# Patient Record
Sex: Female | Born: 1955 | Race: White | Hispanic: No | Marital: Single | Smoking: Never smoker
Health system: Southern US, Community
[De-identification: ages and names within clinical notes are randomized; demographics above are authoritative.]

## PROBLEM LIST (undated history)

## (undated) DIAGNOSIS — I10 Essential (primary) hypertension: Secondary | ICD-10-CM

## (undated) DIAGNOSIS — G35 Multiple sclerosis: Secondary | ICD-10-CM

## (undated) DIAGNOSIS — R569 Unspecified convulsions: Secondary | ICD-10-CM

## (undated) DIAGNOSIS — E119 Type 2 diabetes mellitus without complications: Secondary | ICD-10-CM

## (undated) DIAGNOSIS — I639 Cerebral infarction, unspecified: Secondary | ICD-10-CM

## (undated) HISTORY — PX: APPENDECTOMY: SHX54

## (undated) HISTORY — PX: TOTAL HIP ARTHROPLASTY: SHX124

## (undated) HISTORY — PX: INTERSTIM IMPLANT PLACEMENT: SHX5130

---

## 2015-11-16 ENCOUNTER — Encounter (HOSPITAL_COMMUNITY): Payer: Self-pay | Admitting: Cardiology

## 2015-11-16 ENCOUNTER — Emergency Department (HOSPITAL_COMMUNITY): Payer: Medicare Other

## 2015-11-16 ENCOUNTER — Emergency Department (HOSPITAL_COMMUNITY)
Admission: EM | Admit: 2015-11-16 | Discharge: 2015-11-16 | Disposition: A | Discharge status: Home / Self Care | Payer: Medicare Other | Attending: Emergency Medicine | Admitting: Emergency Medicine

## 2015-11-16 DIAGNOSIS — Z79899 Other long term (current) drug therapy: Secondary | ICD-10-CM | POA: Diagnosis not present

## 2015-11-16 DIAGNOSIS — E119 Type 2 diabetes mellitus without complications: Secondary | ICD-10-CM | POA: Insufficient documentation

## 2015-11-16 DIAGNOSIS — R0789 Other chest pain: Secondary | ICD-10-CM | POA: Diagnosis not present

## 2015-11-16 DIAGNOSIS — Z8673 Personal history of transient ischemic attack (TIA), and cerebral infarction without residual deficits: Secondary | ICD-10-CM | POA: Insufficient documentation

## 2015-11-16 DIAGNOSIS — G40909 Epilepsy, unspecified, not intractable, without status epilepticus: Secondary | ICD-10-CM | POA: Diagnosis not present

## 2015-11-16 DIAGNOSIS — Z794 Long term (current) use of insulin: Secondary | ICD-10-CM | POA: Insufficient documentation

## 2015-11-16 DIAGNOSIS — R079 Chest pain, unspecified: Secondary | ICD-10-CM

## 2015-11-16 DIAGNOSIS — Z7984 Long term (current) use of oral hypoglycemic drugs: Secondary | ICD-10-CM | POA: Diagnosis not present

## 2015-11-16 DIAGNOSIS — I10 Essential (primary) hypertension: Secondary | ICD-10-CM | POA: Diagnosis not present

## 2015-11-16 HISTORY — DX: Unspecified convulsions: R56.9

## 2015-11-16 HISTORY — DX: Multiple sclerosis: G35

## 2015-11-16 HISTORY — DX: Essential (primary) hypertension: I10

## 2015-11-16 HISTORY — DX: Cerebral infarction, unspecified: I63.9

## 2015-11-16 HISTORY — DX: Type 2 diabetes mellitus without complications: E11.9

## 2015-11-16 LAB — CBC WITH DIFFERENTIAL/PLATELET
BASOS ABS: 0 10*3/uL (ref 0.0–0.1)
BASOS PCT: 0 %
EOS ABS: 0.1 10*3/uL (ref 0.0–0.7)
Eosinophils Relative: 1 %
HCT: 41.6 % (ref 36.0–46.0)
HEMOGLOBIN: 13.5 g/dL (ref 12.0–15.0)
LYMPHS ABS: 3 10*3/uL (ref 0.7–4.0)
Lymphocytes Relative: 29 %
MCH: 27.8 pg (ref 26.0–34.0)
MCHC: 32.5 g/dL (ref 30.0–36.0)
MCV: 85.6 fL (ref 78.0–100.0)
Monocytes Absolute: 0.7 10*3/uL (ref 0.1–1.0)
Monocytes Relative: 7 %
NEUTROS PCT: 63 %
Neutro Abs: 6.6 10*3/uL (ref 1.7–7.7)
Platelets: 308 10*3/uL (ref 150–400)
RBC: 4.86 MIL/uL (ref 3.87–5.11)
RDW: 14.1 % (ref 11.5–15.5)
WBC: 10.4 10*3/uL (ref 4.0–10.5)

## 2015-11-16 LAB — COMPREHENSIVE METABOLIC PANEL
ALBUMIN: 4 g/dL (ref 3.5–5.0)
ALK PHOS: 72 U/L (ref 38–126)
ALT: 13 U/L — AB (ref 14–54)
AST: 16 U/L (ref 15–41)
Anion gap: 9 (ref 5–15)
BUN: 25 mg/dL — AB (ref 6–20)
CALCIUM: 8.9 mg/dL (ref 8.9–10.3)
CHLORIDE: 109 mmol/L (ref 101–111)
CO2: 23 mmol/L (ref 22–32)
CREATININE: 0.9 mg/dL (ref 0.44–1.00)
GFR calc Af Amer: >60 mL/min (ref >60)
GFR calc non Af Amer: >60 mL/min (ref >60)
GLUCOSE: 124 mg/dL — AB (ref 65–99)
Potassium: 3.8 mmol/L (ref 3.5–5.1)
SODIUM: 141 mmol/L (ref 135–145)
Total Bilirubin: 0.6 mg/dL (ref 0.3–1.2)
Total Protein: 7 g/dL (ref 6.5–8.1)

## 2015-11-16 LAB — I-STAT TROPONIN, ED
Troponin i, poc: 0.01 ng/mL (ref 0.00–0.08)
Troponin i, poc: 0.01 ng/mL (ref 0.00–0.08)

## 2015-11-16 MED ORDER — ACETAMINOPHEN 325 MG PO TABS
650.0000 mg | ORAL_TABLET | Freq: Once | ORAL | Status: AC
  Administered 2015-11-16: 650 mg via ORAL
  Filled 2015-11-16: qty 2

## 2015-11-16 MED ORDER — SODIUM CHLORIDE 0.9 % IV BOLUS (SEPSIS)
500.0000 mL | Freq: Once | INTRAVENOUS | Status: AC
  Administered 2015-11-16: 500 mL via INTRAVENOUS

## 2015-11-16 MED ORDER — DILTIAZEM HCL 30 MG PO TABS
30.0000 mg | ORAL_TABLET | Freq: Once | ORAL | Status: AC
  Administered 2015-11-16: 30 mg via ORAL
  Filled 2015-11-16: qty 1

## 2015-11-16 MED ORDER — ACETAMINOPHEN 325 MG PO TABS
ORAL_TABLET | ORAL | Status: AC
  Filled 2015-11-16: qty 1

## 2015-11-16 NOTE — Discharge Instructions (Signed)
Rest this weekend and follow-up with their family doctor this week for recheck

## 2015-11-16 NOTE — ED Notes (Signed)
Episode of chest pain and left arm pain.  Also states she felt like she was going to pass out.  Denies any chest pain at this time.  Continues to c/o left arm pain and dizziness.

## 2015-11-16 NOTE — ED Provider Notes (Signed)
CSN: [id number]     Arrival date & time 11/16/15  1600 History   First MD Initiated Contact with Patient 11/16/15 1601     Chief Complaint  Patient presents with  . Chest Pain     (Consider location/radiation/quality/duration/timing/severity/associated sxs/prior Treatment) Patient is a 60 y.o. female presenting with chest pain. The history is provided by the patient (Patient states she had some dizziness today with some chest pain. The chest pain was left-sided last just for a few minutes).  Chest Pain Pain location:  L chest Pain quality: aching   Pain radiates to:  Does not radiate Pain radiates to the back: no   Pain severity:  Mild Onset quality:  Sudden Timing:  Rare Progression:  Resolved Chronicity:  New Associated symptoms: no abdominal pain, no back pain, no cough, no fatigue and no headache     Past Medical History  Diagnosis Date  . Stroke (HCC)   . Multiple sclerosis (HCC)   . Diabetes mellitus without complication (HCC)   . Hypertension   . Seizures [facility])    Past Surgical History  Procedure Laterality Date  . Total hip arthroplasty    . Appendectomy     History reviewed. No pertinent family history. Social History  Substance Use Topics  . Smoking status: Never Smoker   . Smokeless tobacco: None  . Alcohol Use: No   OB History    No data available     Review of Systems  Constitutional: Negative for appetite change and fatigue.  HENT: Negative for congestion, ear discharge and sinus pressure.   Eyes: Negative for discharge.  Respiratory: Negative for cough.   Cardiovascular: Positive for chest pain.  Gastrointestinal: Negative for abdominal pain and diarrhea.  Genitourinary: Negative for frequency and hematuria.  Musculoskeletal: Negative for back pain.  Skin: Negative for rash.  Neurological: Negative for seizures and headaches.  Psychiatric/Behavioral: Negative for hallucinations.      Allergies  Penicillins and Shellfish allergy  Home  Medications   Prior to Admission medications   Medication Sig Start Date End Date Taking? Authorizing Provider  aspirin EC 81 MG tablet Take 81 mg by mouth daily.   Yes Historical Provider, MD  busPIRone (BUSPAR) 30 MG tablet Take 30 mg by mouth 2 (two) times daily.   Yes Historical Provider, MD  canagliflozin (INVOKANA) 100 MG TABS tablet Take 100 mg by mouth daily before breakfast.   Yes Historical Provider, MD  diltiazem (CARDIZEM) 30 MG tablet Take 30 mg by mouth 4 (four) times daily.   Yes Historical Provider, MD  DULoxetine (CYMBALTA) 60 MG capsule Take 60 mg by mouth daily.   Yes Historical Provider, MD  gabapentin (NEURONTIN) 400 MG capsule Take 400 mg by mouth 2 (two) times daily.   Yes Historical Provider, MD  insulin detemir (LEVEMIR) 100 UNIT/ML injection Inject 65 Units into the skin at bedtime.   Yes Historical Provider, MD  levETIRAcetam (KEPPRA) 500 MG tablet Take 500 mg by mouth 2 (two) times daily.   Yes Historical Provider, MD  lisinopril (PRINIVIL,ZESTRIL) 20 MG tablet Take 20 mg by mouth daily.   Yes Historical Provider, MD  metFORMIN (GLUCOPHAGE) 500 MG tablet Take 500 mg by mouth 2 (two) times daily with a meal.   Yes Historical Provider, MD  pravastatin (PRAVACHOL) 40 MG tablet Take 40 mg by mouth daily.   Yes Historical Provider, MD  topiramate (TOPAMAX) 50 MG tablet Take 50 mg by mouth 3 (three) times daily.   Yes Historical Provider,  MD   BP 142/69 mmHg  Pulse 92  Temp(Src) 98.1 F (36.7 C) (Oral)  Resp 15  Ht 5' (1.524 m)  Wt 180 lb (81.647 kg)  BMI 35.15 kg/m2  SpO2 96% Physical Exam  Constitutional: She is oriented to person, place, and time. She appears well-developed.  HENT:  Head: Normocephalic.  Eyes: Conjunctivae and EOM are normal. No scleral icterus.  Neck: Neck supple. No thyromegaly present.  Cardiovascular: Normal rate and regular rhythm.  Exam reveals no gallop and no friction rub.   No murmur heard. Pulmonary/Chest: No stridor. She has no  wheezes. She has no rales. She exhibits no tenderness.  Abdominal: She exhibits no distension. There is no tenderness. There is no rebound.  Musculoskeletal: Normal range of motion. She exhibits no edema.  Lymphadenopathy:    She has no cervical adenopathy.  Neurological: She is oriented to person, place, and time. She exhibits normal muscle tone. Coordination normal.  Skin: No rash noted. No erythema.  Psychiatric: She has a normal mood and affect. Her behavior is normal.    ED Course  Procedures (including critical care time) Labs Review Labs Reviewed  COMPREHENSIVE METABOLIC PANEL - Abnormal; Notable for the following:    Glucose, Bld 124 (*)    BUN 25 (*)    ALT 13 (*)    All other components within normal limits  CBC WITH DIFFERENTIAL/PLATELET  Rosezena SensorI-STAT TROPOININ, ED  Rosezena SensorI-STAT TROPOININ, ED    Imaging Review Dg Chest 2 View  11/16/2015  CLINICAL DATA:  Chest pain EXAM: CHEST  2 VIEW COMPARISON:  None. FINDINGS: Cardiac shadow is within normal limits. The lungs are free of acute infiltrate or sizable effusion. Postsurgical changes in the cervical spine are noted. No acute bony abnormality is seen. IMPRESSION: No active cardiopulmonary disease. Electronically Signed   By: Alcide CleverMark  Lukens M.D.   On: 11/16/2015 17:07   Ct Head Wo Contrast  11/16/2015  CLINICAL DATA:  Dizziness.  Presyncope. EXAM: CT HEAD WITHOUT CONTRAST TECHNIQUE: Contiguous axial images were obtained from the base of the skull through the vertex without intravenous contrast. COMPARISON:  None. FINDINGS: Diffusely enlarged ventricles and subarachnoid spaces. Patchy white matter low density in both cerebral hemispheres. Old left basal ganglia and bilateral thalamic lacunar infarcts. No intracranial hemorrhage, mass lesion or CT evidence of acute infarction. Unremarkable bones and included paranasal sinuses. IMPRESSION: 1. No acute abnormality. 2. Atrophy, chronic small vessel white matter ischemic changes and old lacunar  infarcts. Electronically Signed   By: Beckie SaltsSteven  Reid M.D.   On: 11/16/2015 19:30   I have personally reviewed and evaluated these images and lab results as part of my medical decision-making.   EKG Interpretation   Date/Time:  Saturday November 16 2015 16:08:40 EDT Ventricular Rate:  96 PR Interval:  162 QRS Duration: 87 QT Interval:  352 QTC Calculation: 445 R Axis:   21 Text Interpretation:  Sinus rhythm Borderline low voltage, extremity leads  Probable anteroseptal infarct, old Minimal ST elevation, inferior leads  Confirmed by Dayan Desa  MD, Rosemae Mcquown [phone]([id number]) on 11/16/2015 6:39:53 PM      MDM   Final diagnoses:  Chest pain at rest    Patient had 2 normal troponins a normal EKG and a normal chest x-ray. Doubt coronary artery disease causing chest pain. Patient will follow-up with her PCP this week    Bethann BerkshireJoseph Lasonia Casino, MD 11/16/15 2117

## 2015-11-16 NOTE — ED Notes (Signed)
Selena BattenKim (sister) [phone]

## 2015-11-16 NOTE — ED Notes (Signed)
Pt given meal tray,  

## 2015-11-16 NOTE — ED Notes (Signed)
Nurse unable to obtain sample for ISTAT trop, lab called.

## 2015-11-16 NOTE — ED Notes (Signed)
12 lead complete and given to Dr. Aileen PilotZammitt

## 2016-01-12 ENCOUNTER — Emergency Department (HOSPITAL_COMMUNITY)
Admission: EM | Admit: 2016-01-12 | Discharge: 2016-01-12 | Disposition: A | Discharge status: Home / Self Care | Payer: Medicare Other | Attending: Emergency Medicine | Admitting: Emergency Medicine

## 2016-01-12 ENCOUNTER — Emergency Department (HOSPITAL_COMMUNITY): Payer: Medicare Other

## 2016-01-12 ENCOUNTER — Encounter (HOSPITAL_COMMUNITY): Payer: Self-pay | Admitting: *Deleted

## 2016-01-12 DIAGNOSIS — Z79899 Other long term (current) drug therapy: Secondary | ICD-10-CM | POA: Insufficient documentation

## 2016-01-12 DIAGNOSIS — I10 Essential (primary) hypertension: Secondary | ICD-10-CM | POA: Diagnosis not present

## 2016-01-12 DIAGNOSIS — E162 Hypoglycemia, unspecified: Secondary | ICD-10-CM

## 2016-01-12 DIAGNOSIS — Z794 Long term (current) use of insulin: Secondary | ICD-10-CM | POA: Diagnosis not present

## 2016-01-12 DIAGNOSIS — E11649 Type 2 diabetes mellitus with hypoglycemia without coma: Secondary | ICD-10-CM | POA: Insufficient documentation

## 2016-01-12 DIAGNOSIS — Z7984 Long term (current) use of oral hypoglycemic drugs: Secondary | ICD-10-CM | POA: Insufficient documentation

## 2016-01-12 DIAGNOSIS — Z8673 Personal history of transient ischemic attack (TIA), and cerebral infarction without residual deficits: Secondary | ICD-10-CM | POA: Diagnosis not present

## 2016-01-12 DIAGNOSIS — Z7982 Long term (current) use of aspirin: Secondary | ICD-10-CM | POA: Diagnosis not present

## 2016-01-12 LAB — URINE MICROSCOPIC-ADD ON

## 2016-01-12 LAB — COMPREHENSIVE METABOLIC PANEL
ALBUMIN: 4 g/dL (ref 3.5–5.0)
ALK PHOS: 69 U/L (ref 38–126)
ALT: 12 U/L — AB (ref 14–54)
AST: 14 U/L — ABNORMAL LOW (ref 15–41)
Anion gap: 7 (ref 5–15)
BILIRUBIN TOTAL: 0.4 mg/dL (ref 0.3–1.2)
BUN: 23 mg/dL — ABNORMAL HIGH (ref 6–20)
CALCIUM: 8.7 mg/dL — AB (ref 8.9–10.3)
CO2: 23 mmol/L (ref 22–32)
CREATININE: 0.73 mg/dL (ref 0.44–1.00)
Chloride: 108 mmol/L (ref 101–111)
GFR calc non Af Amer: >60 mL/min (ref >60)
GLUCOSE: 101 mg/dL — AB (ref 65–99)
Potassium: 3.1 mmol/L — ABNORMAL LOW (ref 3.5–5.1)
SODIUM: 138 mmol/L (ref 135–145)
TOTAL PROTEIN: 6.9 g/dL (ref 6.5–8.1)

## 2016-01-12 LAB — URINALYSIS, ROUTINE W REFLEX MICROSCOPIC
BILIRUBIN URINE: NEGATIVE
KETONES UR: NEGATIVE mg/dL
Leukocytes, UA: NEGATIVE
Nitrite: NEGATIVE
PH: 5.5 (ref 5.0–8.0)
Protein, ur: NEGATIVE mg/dL
Specific Gravity, Urine: 1.02 (ref 1.005–1.030)

## 2016-01-12 LAB — CBG MONITORING, ED
GLUCOSE-CAPILLARY: 109 mg/dL — AB (ref 65–99)
Glucose-Capillary: 121 mg/dL — ABNORMAL HIGH (ref 65–99)
Glucose-Capillary: 111 mg/dL — ABNORMAL HIGH (ref 65–99)

## 2016-01-12 LAB — CBC
HCT: 40.2 % (ref 36.0–46.0)
Hemoglobin: 13.1 g/dL (ref 12.0–15.0)
MCH: 28.3 pg (ref 26.0–34.0)
MCHC: 32.6 g/dL (ref 30.0–36.0)
MCV: 86.8 fL (ref 78.0–100.0)
PLATELETS: 307 10*3/uL (ref 150–400)
RBC: 4.63 MIL/uL (ref 3.87–5.11)
RDW: 14.4 % (ref 11.5–15.5)
WBC: 11.8 10*3/uL — ABNORMAL HIGH (ref 4.0–10.5)

## 2016-01-12 NOTE — ED Notes (Signed)
[facility] called out for diabetic issues; pt was found to have a cbg of 45 on scene and pt was lethargic and diaphoretic; pt was given an amp of D50 and cbg came up to 172; pt still lethargic but will answer questions when asked

## 2016-01-12 NOTE — ED Notes (Signed)
Pt ate about 25% of meal tray & states unable to eat anymore.

## 2016-01-12 NOTE — ED Notes (Signed)
Pt says still unable to get urine sample at this time.

## 2016-01-12 NOTE — ED Notes (Signed)
Pt alert & oriented x4, stable gait. Patient given discharge instructions, paperwork & prescription(s). Patient instructed to stop at the registration desk to finish any additional paperwork. Patient verbalized understanding. Pt left department in wheelchair escorted by staff. Pt left w/ no further questions. 

## 2016-01-12 NOTE — ED Notes (Signed)
Pt attempted again to give urine sample on bedpan. Still unable, tech to see EDP about in & out cath.

## 2016-01-12 NOTE — Discharge Instructions (Signed)
HOLD INVOKANA, LEVEMIR, AND METFORMIN IN THE MORNING.  OK TO TAKE METFORMIN AT NIGHT IF BS IS REMAINING ABOVE 125. Hypoglycemia Low blood sugar (hypoglycemia) means that the level of sugar in your blood is lower than it should be. Signs of low blood sugar include:  Getting sweaty.  Feeling hungry.  Feeling dizzy or weak.  Feeling sleepier than normal.  Feeling nervous.  Headaches.  Having a fast heartbeat. Low blood sugar can happen fast and can be an emergency. Your doctor can do tests to check your blood sugar level. You can have low blood sugar and not have diabetes. HOME CARE  Check your blood sugar as told by your doctor. If it is less than 70 mg/dl or as told by your doctor, take 1 of the following:  3 to 4 glucose tablets.   cup clear juice.   cup soda pop, not diet.  1 cup milk.  5 to 6 hard candies.  Recheck blood sugar after 15 minutes. Repeat until it is at the right level.  Eat a snack if it is more than 1 hour until the next meal.  Only take medicine as told by your doctor.  Do not skip meals. Eat on time.  Do not drink alcohol except with meals.  Check your blood glucose before driving.  Check your blood glucose before and after exercise.  Always carry treatment with you, such as glucose pills.  Always wear a medical alert bracelet if you have diabetes. GET HELP RIGHT AWAY IF:   Your blood glucose goes below 70 mg/dl or as told by your doctor, and you:  Are confused.  Are not able to swallow.  Pass out (faint).  You cannot treat yourself. You may need someone to help you.  You have low blood sugar problems often.  You have problems from your medicines.  You are not feeling better after 3 to 4 days.  You have vision changes. MAKE SURE YOU:   Understand these instructions.  Will watch this condition.  Will get help right away if you are not doing well or get worse.   This information is not intended to replace advice given to  you by your health care provider. Make sure you discuss any questions you have with your health care provider.   Document Released: 11/11/2009 Document Revised: 09/07/2014 Document Reviewed: 04/23/2015 Elsevier Interactive Patient Education [employer]2016 Elsevier Inc.

## 2016-01-12 NOTE — ED Provider Notes (Signed)
CSN: [id number]     Arrival date & time 01/12/16  1953 History   First MD Initiated Contact with Patient 01/12/16 1955     Chief Complaint  Patient presents with  . Hypoglycemia   EMS WAS CALLED TO PT'S HOUSE B/C SHE WAS LETHARGIC.  BS WAS 45 ON SCENE.  SHE WAS GIVEN 1 AMP OF D50 AND CBG CAME UP TO 172.  PT IS MORE ALERT, BUT IS STILL SLOW TO ANSWER QUESTIONS.  PT LAST ATE AROUND 1400.  (Consider location/radiation/quality/duration/timing/severity/associated sxs/prior Treatment) Patient is a 60 y.o. female presenting with hypoglycemia. The history is provided by the patient and the EMS personnel.  Hypoglycemia Initial blood sugar:  45 Blood sugar after intervention:  172 Severity:  Severe Onset quality:  Sudden Progression:  Improving Diabetic status:  Controlled with insulin Current diabetic therapy:  LEVEMIR Context: decreased oral intake     Past Medical History  Diagnosis Date  . Stroke (HCC)   . Multiple sclerosis (HCC)   . Diabetes mellitus without complication (HCC)   . Hypertension   . Seizures [facility])    Past Surgical History  Procedure Laterality Date  . Total hip arthroplasty    . Appendectomy     History reviewed. No pertinent family history. Social History  Substance Use Topics  . Smoking status: Never Smoker   . Smokeless tobacco: None  . Alcohol Use: No   OB History    No data available     Review of Systems  All other systems reviewed and are negative.     Allergies  Penicillins and Shellfish allergy  Home Medications   Prior to Admission medications   Medication Sig Start Date End Date Taking? Authorizing Provider  aspirin EC 81 MG tablet Take 81 mg by mouth daily.    Historical Provider, MD  busPIRone (BUSPAR) 30 MG tablet Take 30 mg by mouth 2 (two) times daily.    Historical Provider, MD  canagliflozin (INVOKANA) 100 MG TABS tablet Take 100 mg by mouth daily before breakfast.    Historical Provider, MD  diltiazem (CARDIZEM) 30 MG tablet  Take 30 mg by mouth 4 (four) times daily.    Historical Provider, MD  DULoxetine (CYMBALTA) 60 MG capsule Take 60 mg by mouth daily.    Historical Provider, MD  gabapentin (NEURONTIN) 400 MG capsule Take 400 mg by mouth 2 (two) times daily.    Historical Provider, MD  insulin detemir (LEVEMIR) 100 UNIT/ML injection Inject 65 Units into the skin at bedtime.    Historical Provider, MD  levETIRAcetam (KEPPRA) 500 MG tablet Take 500 mg by mouth 2 (two) times daily.    Historical Provider, MD  lisinopril (PRINIVIL,ZESTRIL) 20 MG tablet Take 20 mg by mouth daily.    Historical Provider, MD  metFORMIN (GLUCOPHAGE) 500 MG tablet Take 500 mg by mouth 2 (two) times daily with a meal.    Historical Provider, MD  pravastatin (PRAVACHOL) 40 MG tablet Take 40 mg by mouth daily.    Historical Provider, MD  topiramate (TOPAMAX) 50 MG tablet Take 50 mg by mouth 3 (three) times daily.    Historical Provider, MD   BP 130/66 mmHg  Resp 15 Physical Exam  Constitutional: She is oriented to person, place, and time. She appears well-developed and well-nourished.  HENT:  Head: Normocephalic and atraumatic.  Right Ear: External ear normal.  Left Ear: External ear normal.  Mouth/Throat: Oropharynx is clear and moist.  Eyes: Conjunctivae are normal. Pupils are equal, round, and  reactive to light.  Neck: Normal range of motion. Neck supple.  Cardiovascular: Normal rate, regular rhythm, normal heart sounds and intact distal pulses.   Pulmonary/Chest: Effort normal and breath sounds normal.  Abdominal: Soft. Bowel sounds are normal.  Musculoskeletal: Normal range of motion.  Neurological: She is alert and oriented to person, place, and time.  Skin: Skin is warm and dry.  Psychiatric: She has a normal mood and affect. She is slowed.  Nursing note and vitals reviewed.   ED Course  Procedures (including critical care time) Labs Review Labs Reviewed  CBC - Abnormal; Notable for the following:    WBC 11.8 (*)     All other components within normal limits  CBG MONITORING, ED - Abnormal; Notable for the following:    Glucose-Capillary 121 (*)    All other components within normal limits  CBG MONITORING, ED - Abnormal; Notable for the following:    Glucose-Capillary 111 (*)    All other components within normal limits  COMPREHENSIVE METABOLIC PANEL  URINALYSIS, ROUTINE W REFLEX MICROSCOPIC (NOT AT [facility])  CBG MONITORING, ED    Imaging Review Dg Chest Port 1 View  01/12/2016  CLINICAL DATA:  HYPOGLYCEMIA, PATIENT STATES " SHE IS A DIABETIC AND EARLIER TODAY SHE BECAME CONFUSED" HISTORY OF STROKE, MULTIPLE SCLEROSIS, SEIZURES, DM, HTN EXAM: PORTABLE CHEST 1 VIEW COMPARISON:  11/16/2015 FINDINGS: Shallow lung inflation. Right lower lobe atelectasis versus early infiltrate noted. Heart size is upper normal. Previous cervical spine fusion. IMPRESSION: Right lower lobe atelectasis or early infiltrate. Electronically Signed   By: Norva PavlovElizabeth  Brown M.D.   On: 01/12/2016 21:41   I have personally reviewed and evaluated these images and lab results as part of my medical decision-making.   EKG Interpretation   Date/Time:  Sunday Jan 12 2016 20:08:19 EDT Ventricular Rate:  77 PR Interval:  180 QRS Duration: 94 QT Interval:  408 QTC Calculation: 462 R Axis:   12 Text Interpretation:  Sinus rhythm Borderline low voltage, extremity leads  Confirmed by Kalliope Riesen MD, Leron Stoffers ([id number]) on 01/12/2016 8:12:40 PM      MDM  PT HAS CONTINUED TO DO WELL HERE.  SHE HAS EATEN AND HAS DRANK SOME JUICE.  SHE IS AWAKE AND ALERT.  PT IS TO HOLD INVOKANA, LEVEMIR, AND METFORMIN TOMORROW MORNING.  SHE CAN TAKE THE EVENING METFORMIN IF HER BS IS HIGHER THAN 125 TOMORROW NIGHT.  THE PT WILL BE GOING HOME WITH HER FAMILY MEMBER.   Final diagnoses:  Hypoglycemia        Jacalyn LefevreJulie Omeed Osuna, MD 01/12/16 2207

## 2016-05-30 ENCOUNTER — Emergency Department (HOSPITAL_COMMUNITY)
Admission: EM | Admit: 2016-05-30 | Discharge: 2016-05-30 | Disposition: A | Discharge status: Home / Self Care | Payer: Medicare Other | Attending: Emergency Medicine | Admitting: Emergency Medicine

## 2016-05-30 ENCOUNTER — Encounter (HOSPITAL_COMMUNITY): Payer: Self-pay | Admitting: Emergency Medicine

## 2016-05-30 DIAGNOSIS — Z7984 Long term (current) use of oral hypoglycemic drugs: Secondary | ICD-10-CM | POA: Diagnosis not present

## 2016-05-30 DIAGNOSIS — T814XXA Infection following a procedure, initial encounter: Secondary | ICD-10-CM | POA: Insufficient documentation

## 2016-05-30 DIAGNOSIS — I10 Essential (primary) hypertension: Secondary | ICD-10-CM | POA: Insufficient documentation

## 2016-05-30 DIAGNOSIS — Z7982 Long term (current) use of aspirin: Secondary | ICD-10-CM | POA: Insufficient documentation

## 2016-05-30 DIAGNOSIS — E119 Type 2 diabetes mellitus without complications: Secondary | ICD-10-CM | POA: Diagnosis not present

## 2016-05-30 DIAGNOSIS — Y69 Unspecified misadventure during surgical and medical care: Secondary | ICD-10-CM | POA: Diagnosis not present

## 2016-05-30 DIAGNOSIS — Z79899 Other long term (current) drug therapy: Secondary | ICD-10-CM | POA: Diagnosis not present

## 2016-05-30 DIAGNOSIS — Z794 Long term (current) use of insulin: Secondary | ICD-10-CM | POA: Insufficient documentation

## 2016-05-30 DIAGNOSIS — Z9889 Other specified postprocedural states: Secondary | ICD-10-CM

## 2016-05-30 LAB — CBC WITH DIFFERENTIAL/PLATELET
BASOS ABS: 0 10*3/uL (ref 0.0–0.1)
BASOS PCT: 0 %
Eosinophils Absolute: 0.2 10*3/uL (ref 0.0–0.7)
Eosinophils Relative: 2 %
HEMATOCRIT: 39.6 % (ref 36.0–46.0)
Hemoglobin: 13 g/dL (ref 12.0–15.0)
Lymphocytes Relative: 37 %
Lymphs Abs: 3.5 10*3/uL (ref 0.7–4.0)
MCH: 29.5 pg (ref 26.0–34.0)
MCHC: 32.8 g/dL (ref 30.0–36.0)
MCV: 90 fL (ref 78.0–100.0)
MONO ABS: 0.7 10*3/uL (ref 0.1–1.0)
Monocytes Relative: 8 %
NEUTROS ABS: 5 10*3/uL (ref 1.7–7.7)
NEUTROS PCT: 53 %
Platelets: 351 10*3/uL (ref 150–400)
RBC: 4.4 MIL/uL (ref 3.87–5.11)
RDW: 13.5 % (ref 11.5–15.5)
WBC: 9.4 10*3/uL (ref 4.0–10.5)

## 2016-05-30 NOTE — Discharge Instructions (Signed)
PLEASE RETURN FOR WOUND CHECK FOR ANY BLEEDING/DRAINAGE/WARMTH OR FEVER OVER 100.5616f

## 2016-05-30 NOTE — ED Provider Notes (Signed)
[facility]-EMERGENCY DEPT Provider Note   CSN: [id number] Arrival date & time: 05/30/16  1608     History   Chief Complaint Chief Complaint  Patient presents with  . Post-op Problem    HPI Erica Friedman is a 60 y.o. female.  The history is provided by the patient and a relative.  Patient presents for post op problem Pt has h/o M-S and also urinary urgency [facility] No complications However over past 24 hours her family has noted swelling to area No drainage/erythema.  She has minimal pain No trauma to area No other new complaints   Past Medical History:  Diagnosis Date  . Diabetes mellitus without complication (HCC)   . Hypertension   . Multiple sclerosis (HCC)   . Seizures (HCC)   . Stroke [facility](HCC)     There are no active problems to display for this patient.   Past Surgical History:  Procedure Laterality Date  . APPENDECTOMY    . INTERSTIM IMPLANT PLACEMENT    . TOTAL HIP ARTHROPLASTY      OB History    Gravida Para Term Preterm AB Living   2 2 2     2    SAB TAB Ectopic Multiple Live Births                   Home Medications    Prior to Admission medications   Medication Sig Start Date End Date Taking? Authorizing Provider  aspirin EC 81 MG tablet Take 81 mg by mouth every evening.    Yes Historical Provider, MD  buPROPion (WELLBUTRIN SR) 150 MG 12 hr tablet Take 150 mg by mouth 2 (two) times daily.   Yes Historical Provider, MD  busPIRone (BUSPAR) 30 MG tablet Take 30 mg by mouth 2 (two) times daily.   Yes Historical Provider, MD  diltiazem (CARDIZEM) 30 MG tablet Take 30 mg by mouth 4 (four) times daily.   Yes Historical Provider, MD  DULoxetine (CYMBALTA) 60 MG capsule Take 60 mg by mouth 2 (two) times daily.    Yes Historical Provider, MD  gabapentin (NEURONTIN) 400 MG capsule Take 400 mg by mouth 3 (three) times daily.    Yes Historical Provider, MD  HYDROcodone-acetaminophen (NORCO/VICODIN)  5-325 MG tablet Take 1 tablet by mouth every 6 (six) hours as needed. 05/25/16  Yes Historical Provider, MD  insulin detemir (LEVEMIR) 100 UNIT/ML injection Inject 65 Units into the skin every morning.    Yes Historical Provider, MD  levETIRAcetam (KEPPRA) 500 MG tablet Take 500 mg by mouth 2 (two) times daily.   Yes Historical Provider, MD  lisinopril (PRINIVIL,ZESTRIL) 20 MG tablet Take 20 mg by mouth every evening.    Yes Historical Provider, MD  metFORMIN (GLUCOPHAGE) 500 MG tablet Take 500-1,000 mg by mouth 2 (two) times daily with a meal. 500mg  in the morning and 1000mg  in the evening   Yes Historical Provider, MD  Multiple Vitamin (MULTIVITAMIN WITH MINERALS) TABS tablet Take 1 tablet by mouth daily.   Yes Historical Provider, MD  pravastatin (PRAVACHOL) 40 MG tablet Take 40 mg by mouth daily.   Yes Historical Provider, MD  sulfamethoxazole-trimethoprim (BACTRIM DS,SEPTRA DS) 800-160 MG tablet Take 1 tablet by mouth 2 (two) times daily. 14 day course starting on 05/25/2016 05/25/16 06/08/16 Yes Historical Provider, MD  topiramate (TOPAMAX) 50 MG tablet Take 50 mg by mouth 2 (two) times daily.    Yes Historical Provider, MD  Family History History reviewed. No pertinent family history.  Social History Social History  Substance Use Topics  . Smoking status: Never Smoker  . Smokeless tobacco: Never Used  . Alcohol use No     Allergies   Penicillins and Shellfish allergy   Review of Systems Review of Systems  Constitutional: Negative for fever.  Gastrointestinal: Negative for abdominal pain and vomiting.  Musculoskeletal: Negative for back pain.  Skin: Positive for wound.  All other systems reviewed and are negative.    Physical Exam Updated Vital Signs BP (!) 131/107 (BP Location: Left Arm)   Pulse 101   Temp 97.8 F (36.6 C) (Oral)   Resp 20   SpO2 95%   Physical Exam  CONSTITUTIONAL: Well developed/well nourished HEAD: Normocephalic/atraumatic EYES: EOMI ENMT:  Mucous membranes moist NECK: supple no meningeal signs SPINE/BACK:entire spine nontender CV: S1/S2 noted, no murmurs/rubs/gallops noted LUNGS: Lungs are clear to auscultation bilaterally, no apparent distress ABDOMEN: soft, nontender NEURO: Pt is awake/alert/appropriate, moves all extremitiesx4. EXTREMITIES: pulses normal/equal, full ROM.  Wound to left buttock is clean/dry/intact.  No erythema.  No warmth.  ?small hematoma but no significant bruising.  Full ROM of left hip without difficulty SKIN: warm, color normal PSYCH: no abnormalities of mood noted, alert and oriented to situation  ED Treatments / Results  Labs (all labs ordered are listed, but only abnormal results are displayed) Labs Reviewed  CBC WITH DIFFERENTIAL/PLATELET    EKG  EKG Interpretation None       Radiology No results found.  Procedures Procedures (including critical care time)  Medications Ordered in ED Medications - No data to display   Initial Impression / Assessment and Plan / ED Course  I have reviewed the triage vital signs and the nursing notes.  Pertinent labs results that were available during my care of the patient were reviewed by me and considered in my medical decision making (see chart for details).  Clinical Course    I SPOKE TO ON CALL UROLOGIST AT [facility] HE REPORTED THAT IF WBC LESS THAN 12, NO NEED FOR ANTIBIOTICS PT WELL APPEARING I FEEL WOUND IS HEALING WELL WITHOUT INFECTION D/C HOME   Final Clinical Impressions(s) / ED Diagnoses   Final diagnoses:  Post-operative state    New Prescriptions New Prescriptions   No medications on file     Zadie Rhine, MD 05/30/16 1801

## 2016-05-30 NOTE — ED Triage Notes (Signed)
Patient c/o post-op complications. Patient had stimulator placed for statica. Patient reports swelling, pain, and "heat" at surgical site that started this morning. Per patient surgical site is left gluteus maximus.

## 2016-05-30 NOTE — ED Notes (Signed)
Pt made aware to return if symptoms worsen or if any life threatening symptoms occur.   

## 2017-07-13 IMAGING — CT CT HEAD W/O CM
1 series · 16 of 30 positions shown, 20 images · non-contrast
Comparison: None.

CLINICAL DATA: Dizziness.  Presyncope.

EXAM:
CT HEAD WITHOUT CONTRAST
TECHNIQUE: Contiguous axial images were obtained from the base of the skull
through the vertex without intravenous contrast.

[Series 2: headseq 4.8 h37s · axial · 0.43mm/px · z∈[+101,+234]mm · 16 of 30 slices shown, 20 images]
[im 2/30  brain]
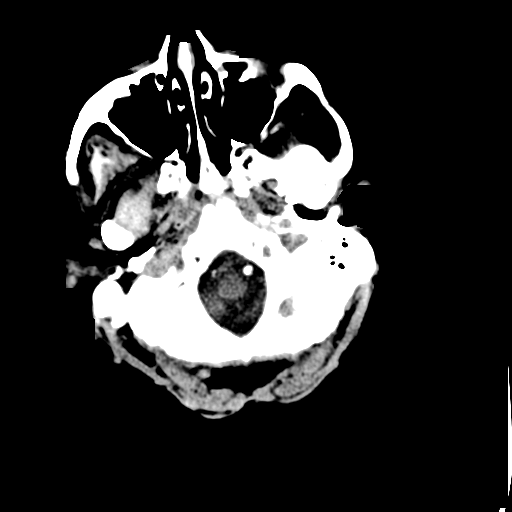
[im 2/30  bone]
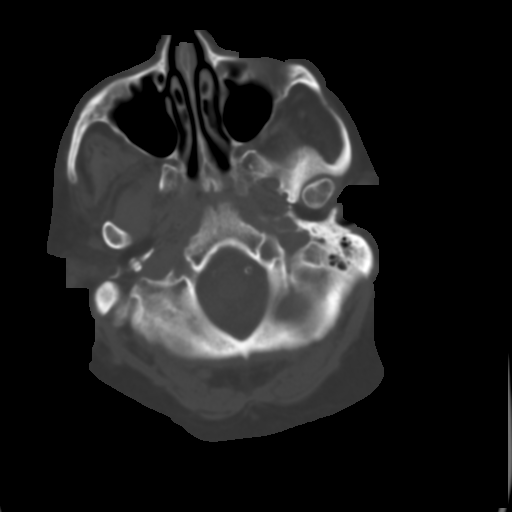
[im 4/30  brain]
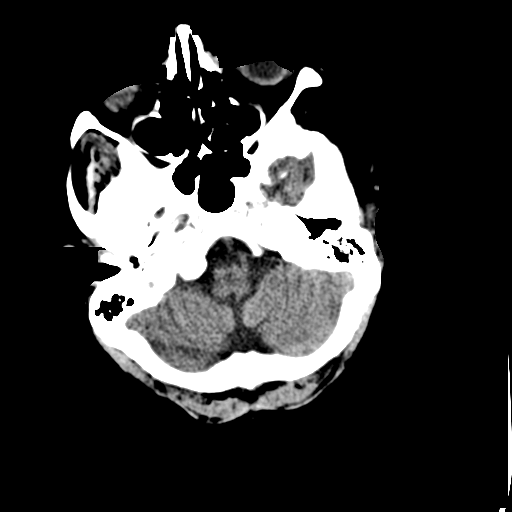
[im 6/30  brain]
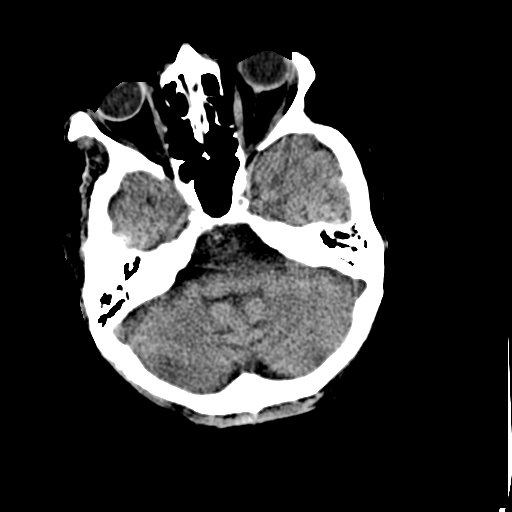
[im 8/30  brain]
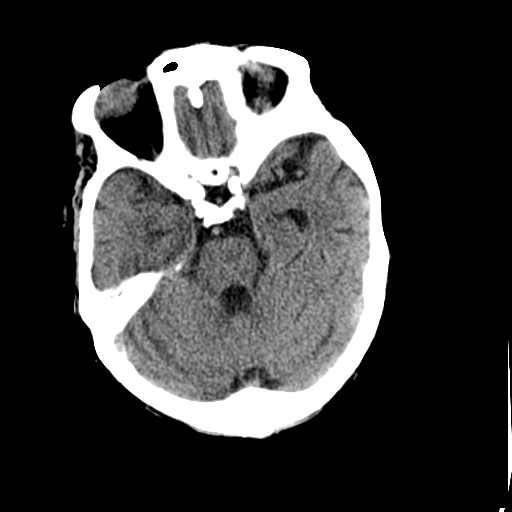
[im 9/30  brain]
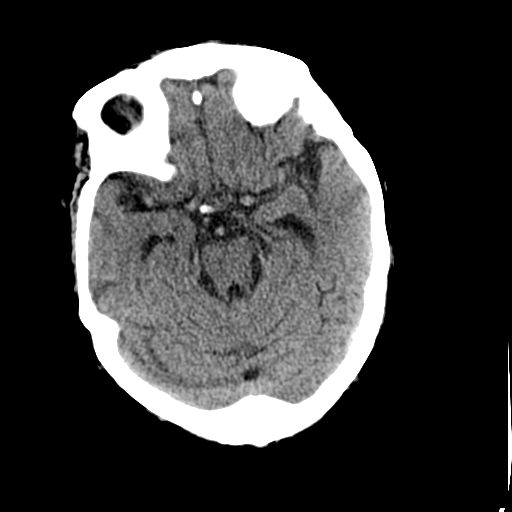
[im 9/30  bone]
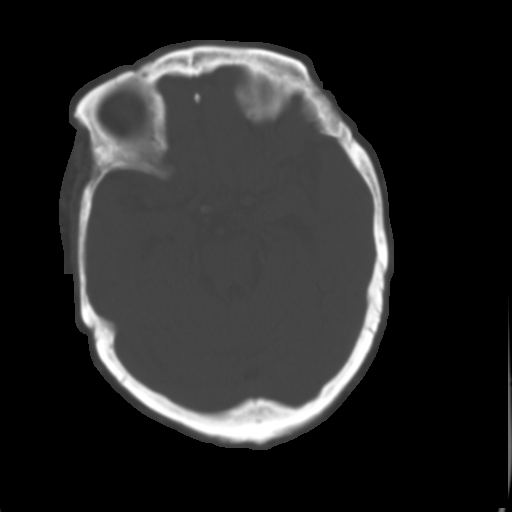
[im 11/30  brain]
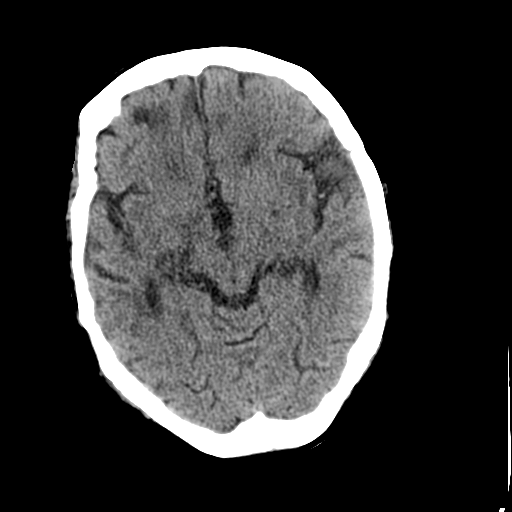
[im 13/30  brain]
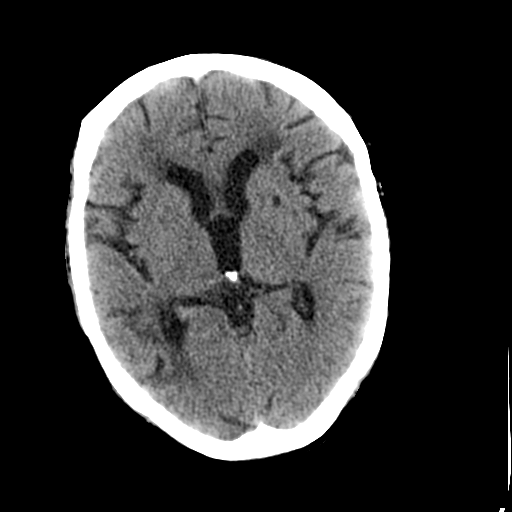
[im 15/30  brain]
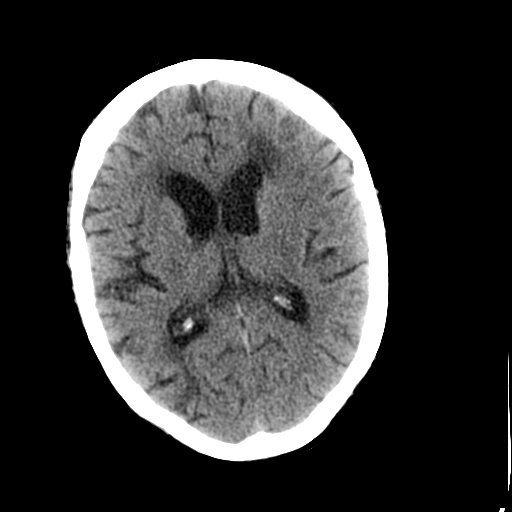
[im 16/30  brain]
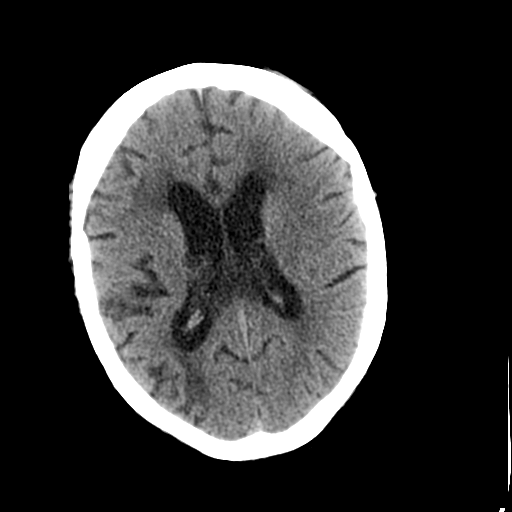
[im 16/30  bone]
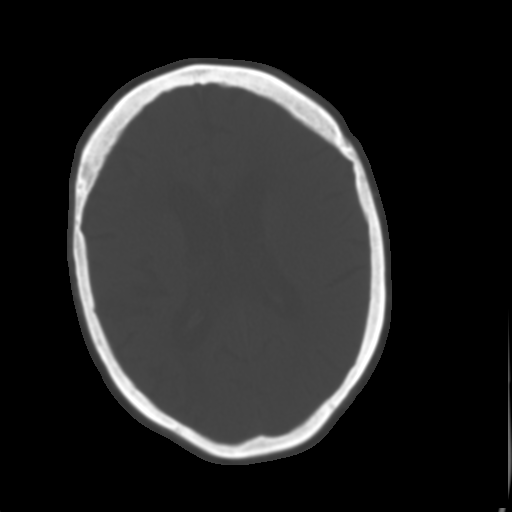
[im 18/30  brain]
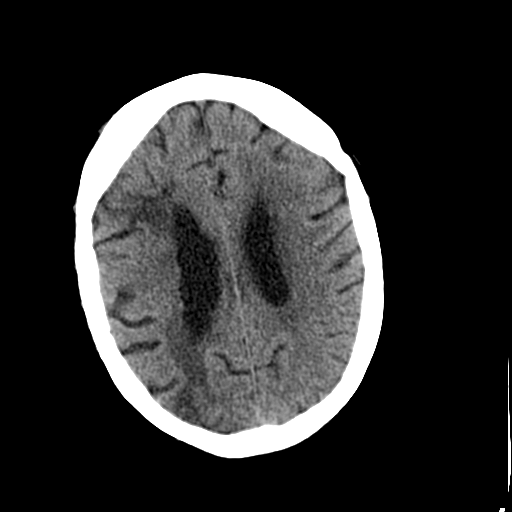
[im 20/30  brain]
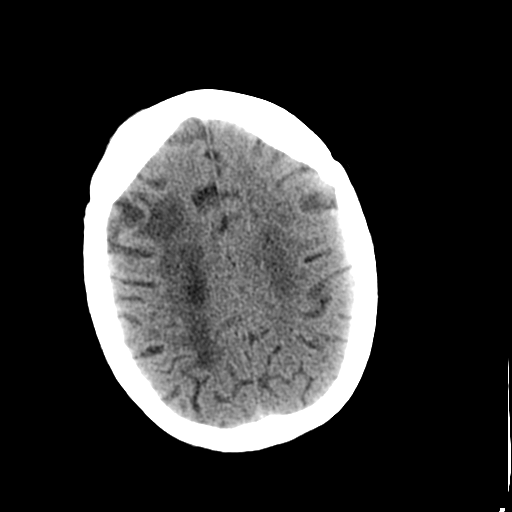
[im 22/30  brain]
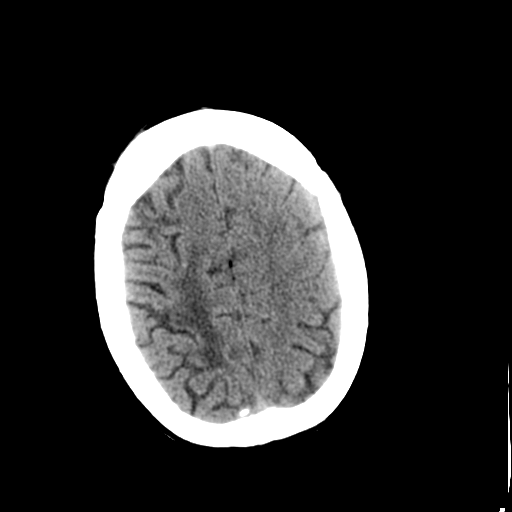
[im 23/30  brain]
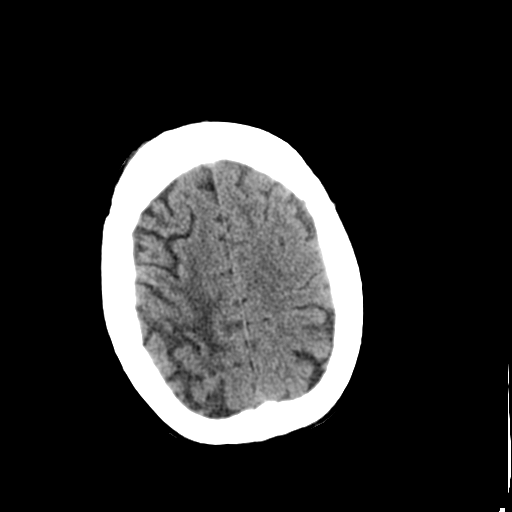
[im 23/30  bone]
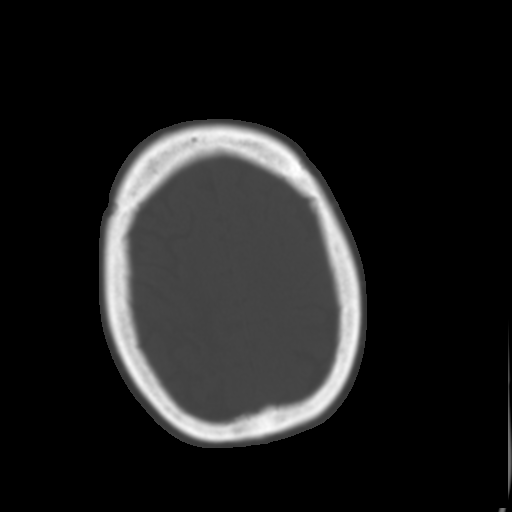
[im 25/30  brain]
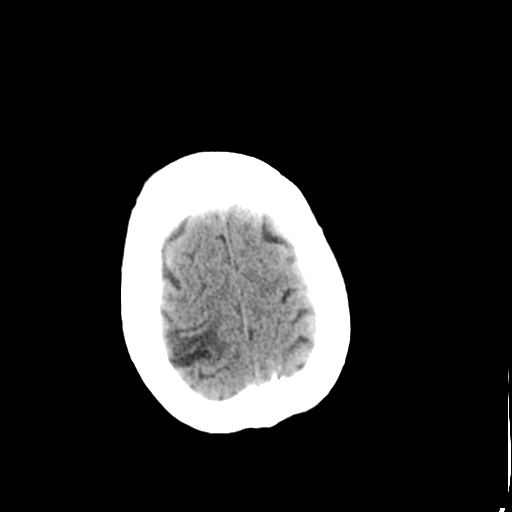
[im 27/30  brain]
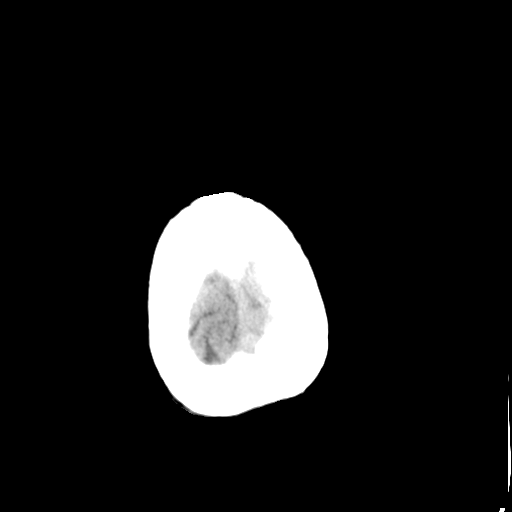
[im 29/30  brain]
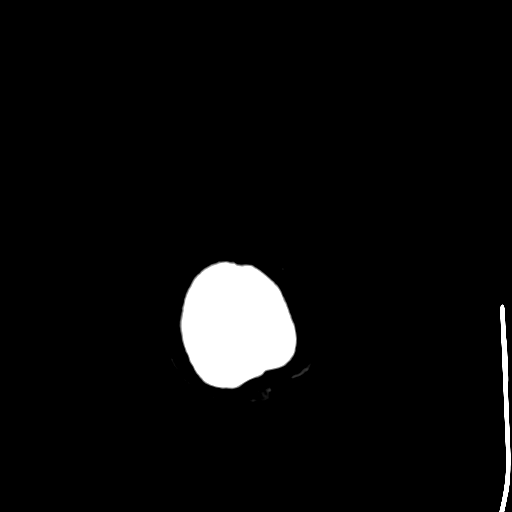

[16 of 30 positions shown; findings below may reference images not displayed]

FINDINGS: Diffusely enlarged ventricles and subarachnoid spaces. Patchy white
matter low density in both cerebral hemispheres. Old left basal
ganglia and bilateral thalamic lacunar infarcts. No intracranial
hemorrhage, mass lesion or CT evidence of acute infarction.
Unremarkable bones and included paranasal sinuses.
IMPRESSION: 1. No acute abnormality.
2. Atrophy, chronic small vessel white matter ischemic changes and
old lacunar infarcts.

## 2022-07-31 DEATH — deceased
